# Patient Record
Sex: Male | Born: 1985 | Hispanic: Yes
Health system: Southern US, Community
[De-identification: ages and names within clinical notes are randomized; demographics above are authoritative.]

---

## 2018-11-19 ENCOUNTER — Emergency Department (HOSPITAL_COMMUNITY): Payer: Medicaid Other

## 2018-11-19 ENCOUNTER — Emergency Department (HOSPITAL_COMMUNITY)
Admission: EM | Admit: 2018-11-19 | Discharge: 2018-11-19 | Discharge status: Against Medical Advice | Payer: Medicaid Other | Attending: Emergency Medicine | Admitting: Emergency Medicine

## 2018-11-19 DIAGNOSIS — R609 Edema, unspecified: Secondary | ICD-10-CM

## 2018-11-19 DIAGNOSIS — Z5321 Procedure and treatment not carried out due to patient leaving prior to being seen by health care provider: Secondary | ICD-10-CM | POA: Insufficient documentation

## 2018-11-19 DIAGNOSIS — R2231 Localized swelling, mass and lump, right upper limb: Secondary | ICD-10-CM | POA: Diagnosis present

## 2018-11-19 NOTE — ED Notes (Signed)
Attempted to call pt x3, without answer.

## 2018-11-19 NOTE — ED Triage Notes (Signed)
Pt states that he was awaken to pain in the arm and then he noticed that it had started to swell. Also complaining of shortness of breath.

## 2018-11-19 NOTE — ED Notes (Signed)
Called pt multiple times; no answer and unable to locate in Department.

## 2019-07-28 IMAGING — CR DG FOREARM 2V*R*
2 series · 2 of 2 positions shown · non-contrast
Comparison: None.

CLINICAL DATA: Swollen right arm.

EXAM:
RIGHT FOREARM - 2 VIEW

[forearm ap]
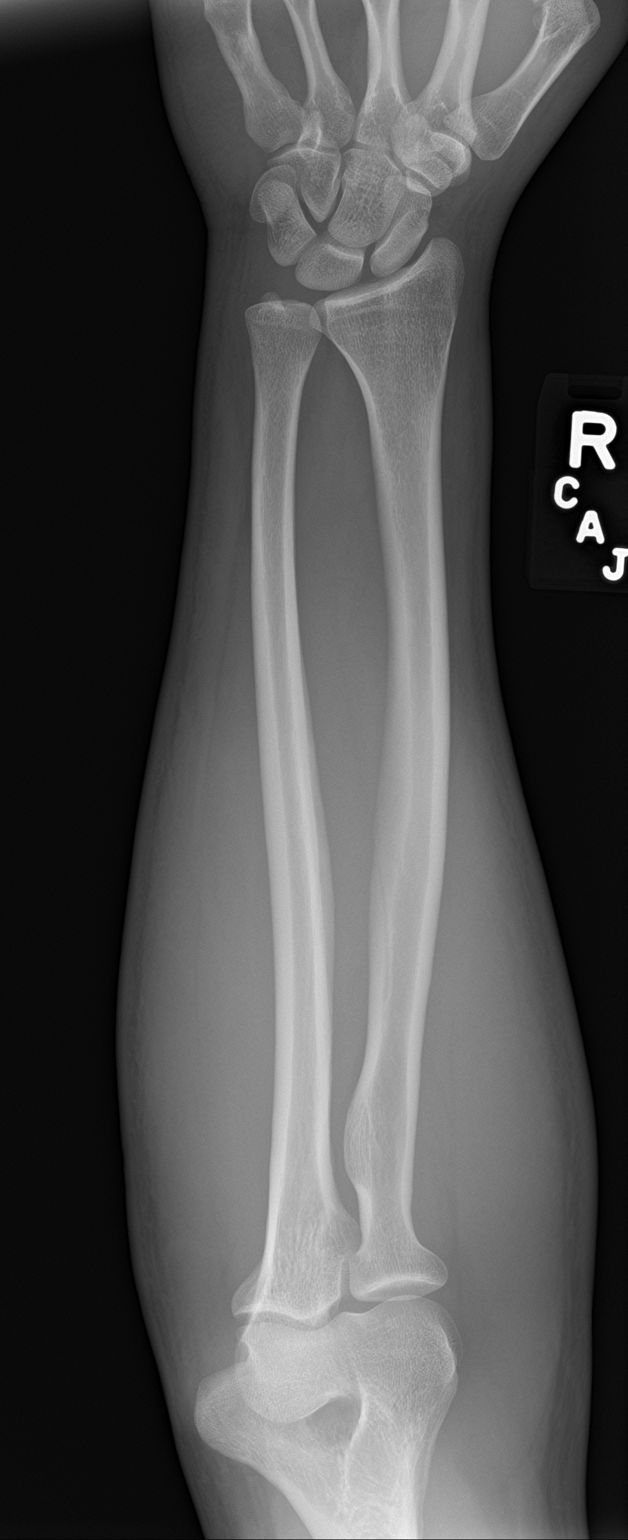

[forearm lat]
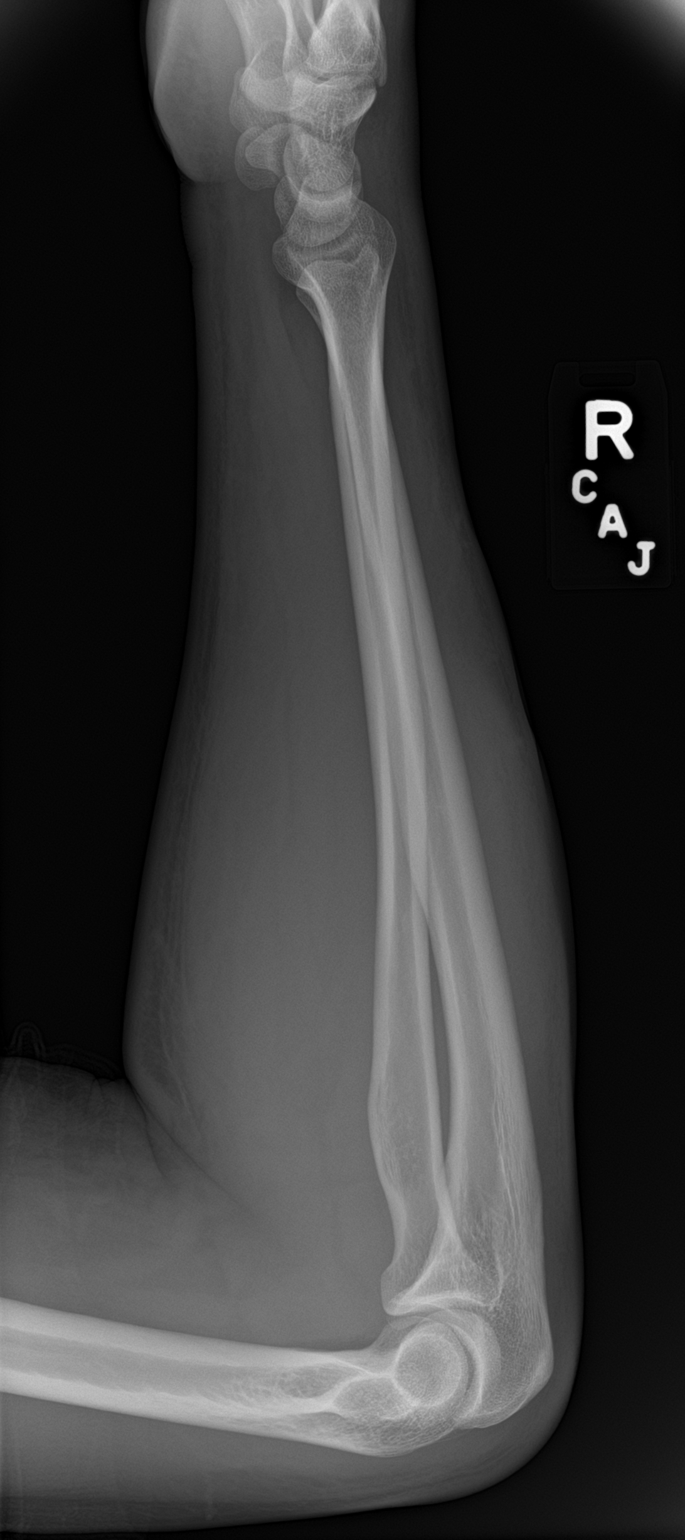

[2 of 2 positions shown; findings below may reference images not displayed]

FINDINGS: There is no evidence of fracture or other focal bone lesions. Soft
tissues are unremarkable.
IMPRESSION: Negative.
